# Patient Record
Sex: Female | Born: 2000 | Hispanic: No | Marital: Single | Smoking: Never smoker
Health system: Southern US, Community
[De-identification: ages and names within clinical notes are randomized; demographics above are authoritative.]

## PROBLEM LIST (undated history)

## (undated) HISTORY — PX: TONSILECTOMY, ADENOIDECTOMY, BILATERAL MYRINGOTOMY AND TUBES: SHX2538

---
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
## 2020-11-09 ENCOUNTER — Other Ambulatory Visit: Payer: Self-pay

## 2020-11-09 ENCOUNTER — Ambulatory Visit
Admission: EM | Admit: 2020-11-09 | Discharge: 2020-11-09 | Disposition: A | Discharge status: Home / Self Care | Payer: No Typology Code available for payment source | Attending: Internal Medicine | Admitting: Internal Medicine

## 2020-11-09 DIAGNOSIS — R1030 Lower abdominal pain, unspecified: Secondary | ICD-10-CM | POA: Diagnosis not present

## 2020-11-09 LAB — POCT URINALYSIS DIP (MANUAL ENTRY)
Bilirubin, UA: NEGATIVE
Glucose, UA: NEGATIVE mg/dL
Ketones, POC UA: NEGATIVE mg/dL
Leukocytes, UA: NEGATIVE
Nitrite, UA: NEGATIVE
Protein Ur, POC: NEGATIVE mg/dL
Spec Grav, UA: 1.02 (ref 1.010–1.025)
Urobilinogen, UA: 0.2 E.U./dL
pH, UA: 6.5 (ref 5.0–8.0)

## 2020-11-09 LAB — POCT URINE PREGNANCY: Preg Test, Ur: NEGATIVE

## 2020-11-09 MED ORDER — IBUPROFEN 600 MG PO TABS: 600.0000 mg | ORAL_TABLET | Freq: Four times a day (QID) | ORAL | 0 refills | Status: AC | PRN

## 2020-11-09 NOTE — Discharge Instructions (Signed)
Take medications as directed If you have worsening abdominal pain, persistent vomiting, fever or abdominal distention please go to the emergency room immediately to be evaluated.

## 2020-11-09 NOTE — ED Triage Notes (Signed)
Pt c/o RLQ pain radiating to center lower abdomen x2hrs. States pain was very intense and has eased off some. States pain when walking. States had nausea and diarrhea last night.

## 2020-11-09 NOTE — ED Provider Notes (Addendum)
[facility]    CSN: [id number] Arrival date & time: 11/09/20  1750      History   Chief Complaint Chief Complaint  Patient presents with  . Abdominal Pain    HPI Marissa Morse is a 20 y.o. female comes to the emergency department with complaints of lower abdominal pain of 2 hours duration.  Patient had crampy lower abdominal pain yesterday a few hours after eating a chicken sandwich.  She had 1 bout of diarrhea.  Pain improved until earlier today when patient had severe abdominal pain.  Pain was initially 10 out of 10 but is currently 5 out of 10.  Is associated with some nausea but no vomiting.  No abdominal distention.  Pain is aggravated by palpation.  No known relieving factors.  No dysuria urgency or frequency.  No fever or chills. HPI  History reviewed. No pertinent past medical history.  There are no problems to display for this patient.   Past Surgical History:  Procedure Laterality Date  . TONSILECTOMY, ADENOIDECTOMY, BILATERAL MYRINGOTOMY AND TUBES      OB History    Gravida  0   Para  0   Term  0   Preterm  0   AB  0   Living  0     SAB  0   IAB  0   Ectopic  0   Multiple  0   Live Births               Home Medications    Prior to Admission medications   Medication Sig Start Date End Date Taking? Authorizing Provider  ibuprofen (ADVIL) 600 MG tablet Take 1 tablet (600 mg total) by mouth every 6 (six) hours as needed. 11/09/20  Yes Estrella Alcaraz, Britta Mccreedy, MD    Family History History reviewed. No pertinent family history.  Social History Social History   Tobacco Use  . Smoking status: Never Smoker  . Smokeless tobacco: Never Used  Substance Use Topics  . Alcohol use: No  . Drug use: No     Allergies   Patient has no known allergies.   Review of Systems Review of Systems  Respiratory: Negative for shortness of breath and wheezing.   Gastrointestinal: Positive for abdominal pain. Negative for diarrhea, nausea  and vomiting.  Genitourinary: Negative for dysuria, frequency and urgency.     Physical Exam Triage Vital Signs ED Triage Vitals  Enc Vitals Group     BP 11/09/20 1859 (!) 173/105     Pulse Rate 11/09/20 1859 (!) 105     Resp 11/09/20 1859 18     Temp 11/09/20 1859 99.5 F (37.5 C)     Temp Source 11/09/20 1859 Oral     SpO2 --      Weight --      Height --      Head Circumference --      Peak Flow --      Pain Score 11/09/20 1900 5     Pain Loc --      Pain Edu? --      Excl. in GC? --    No data found.  Updated Vital Signs BP (!) 173/105 (BP Location: Left Arm)   Pulse (!) 105   Temp 99.5 F (37.5 C) (Oral)   Resp 18   LMP 10/14/2020   Visual Acuity Right Eye Distance:   Left Eye Distance:   Bilateral Distance:    Right Eye Near:  Left Eye Near:    Bilateral Near:     Physical Exam Vitals and nursing note reviewed.  Constitutional:      Appearance: She is not ill-appearing.  HENT:     Mouth/Throat:     Mouth: Mucous membranes are moist.     Pharynx: Oropharynx is clear.  Cardiovascular:     Heart sounds: Normal heart sounds.  Pulmonary:     Effort: Pulmonary effort is normal.     Breath sounds: Normal breath sounds.  Abdominal:     General: Bowel sounds are normal.     Palpations: Abdomen is soft.     Tenderness: There is abdominal tenderness in the right lower quadrant and suprapubic area. Negative signs include Murphy's sign, Rovsing's sign, McBurney's sign and psoas sign.     Hernia: No hernia is present.  Genitourinary:    Adnexa: Right adnexa normal.  Neurological:     Mental Status: She is alert.      UC Treatments / Results  Labs (all labs ordered are listed, but only abnormal results are displayed) Labs Reviewed  POCT URINALYSIS DIP (MANUAL ENTRY) - Abnormal; Notable for the following components:      Result Value   Blood, UA trace-intact (*)    All other components within normal limits  POCT URINE PREGNANCY     EKG   Radiology No results found.  Procedures Procedures (including critical care time)  Medications Ordered in UC Medications - No data to display  Initial Impression / Assessment and Plan / UC Course  I have reviewed the triage vital signs and the nursing notes.  Pertinent labs & imaging results that were available during my care of the patient were reviewed by me and considered in my medical decision making (see chart for details).     1.  Lower abdominal pain: Ibuprofen 600 mg every 6 hours as needed for pain Point-of-care urinalysis is negative for UTI Patient is advised to go to ED if abdominal pain worsens Increase oral fluid intake. Final Clinical Impressions(s) / UC Diagnoses   Final diagnoses:  Lower abdominal pain     Discharge Instructions     Take medications as directed If you have worsening abdominal pain, persistent vomiting, fever or abdominal distention please go to the emergency room immediately to be evaluated.   ED Prescriptions    Medication Sig Dispense Auth. Provider   ibuprofen (ADVIL) 600 MG tablet Take 1 tablet (600 mg total) by mouth every 6 (six) hours as needed. 30 tablet Daisean Brodhead, Britta Mccreedy, MD     PDMP not reviewed this encounter.   Merrilee Jansky, MD 11/09/20 1944    Merrilee Jansky, MD 11/09/20 [phone]
